# Patient Record
Sex: Male | Born: 1950 | Race: Black or African American | Hispanic: No | Marital: Married | Smoking: Never smoker
Health system: Southern US, Community
[De-identification: ages and names within clinical notes are randomized; demographics above are authoritative.]

## PROBLEM LIST (undated history)

## (undated) DIAGNOSIS — E7800 Pure hypercholesterolemia, unspecified: Secondary | ICD-10-CM

---

## 2002-06-18 ENCOUNTER — Emergency Department (HOSPITAL_COMMUNITY): Admission: EM | Admit: 2002-06-18 | Discharge: 2002-06-19 | Payer: Self-pay | Admitting: Emergency Medicine

## 2008-01-27 ENCOUNTER — Emergency Department (HOSPITAL_COMMUNITY): Admission: EM | Admit: 2008-01-27 | Discharge: 2008-01-27 | Payer: Self-pay | Admitting: Emergency Medicine

## 2008-02-27 ENCOUNTER — Ambulatory Visit (HOSPITAL_COMMUNITY): Admission: RE | Admit: 2008-02-27 | Discharge: 2008-02-27 | Payer: Self-pay | Admitting: Family Medicine

## 2008-09-02 IMAGING — CR DG LUMBAR SPINE COMPLETE 4+V
5 series · 5 of 5 positions shown · non-contrast
Comparison: None

CLINICAL DATA: History of lifting injury with chronic pain in the
lower back for a few weeks.

LUMBAR SPINE - COMPLETE 4+ VIEW

[t l-spine a.p.]
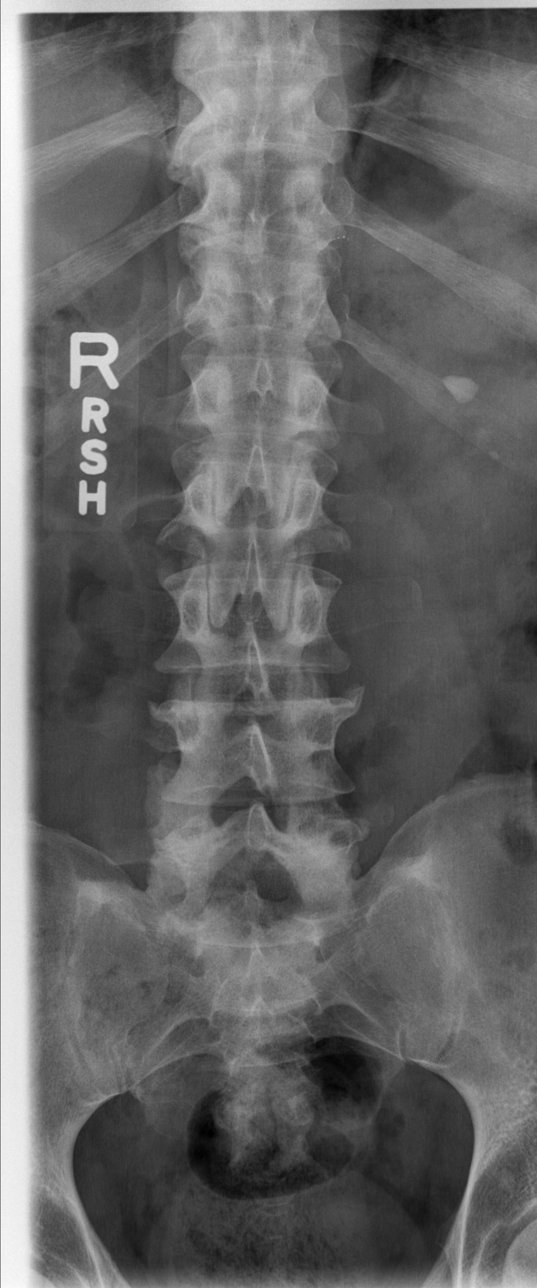

[t l-spine oblique exposure (1 of 2)]
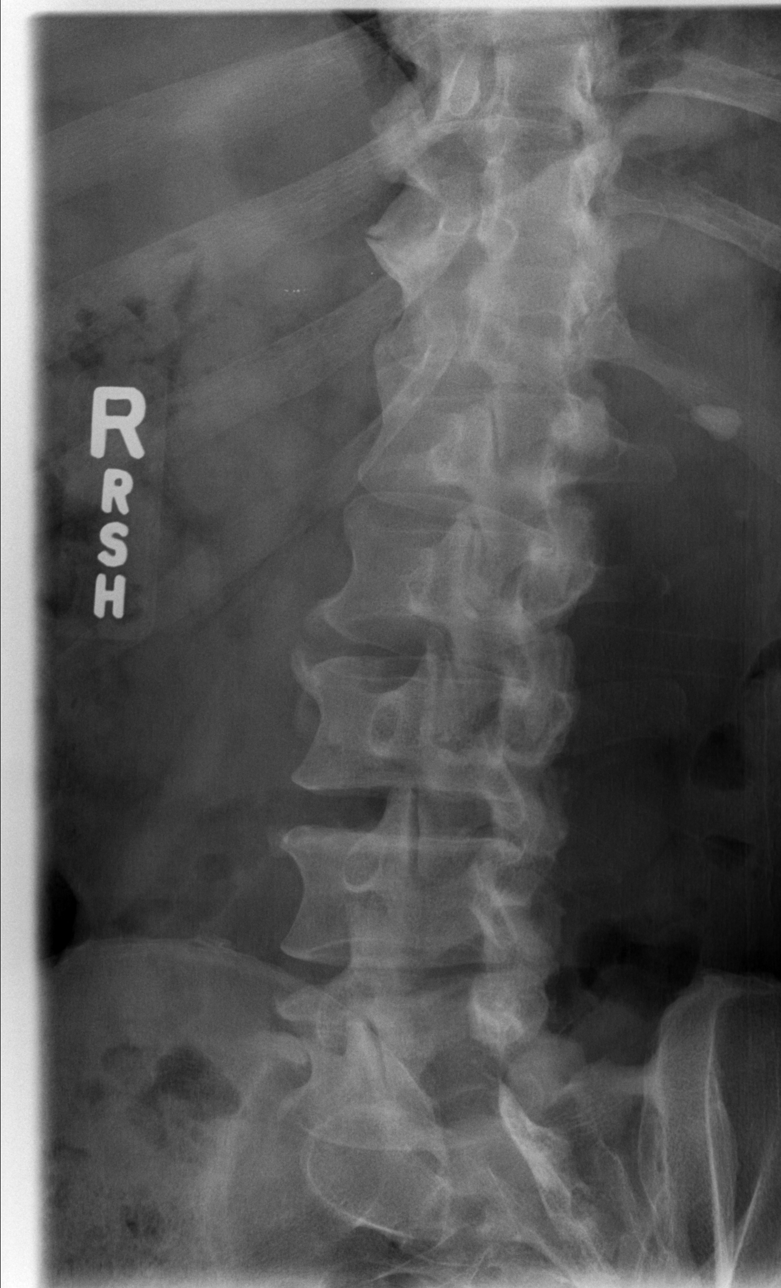

[t l-spine oblique exposure (2 of 2)]
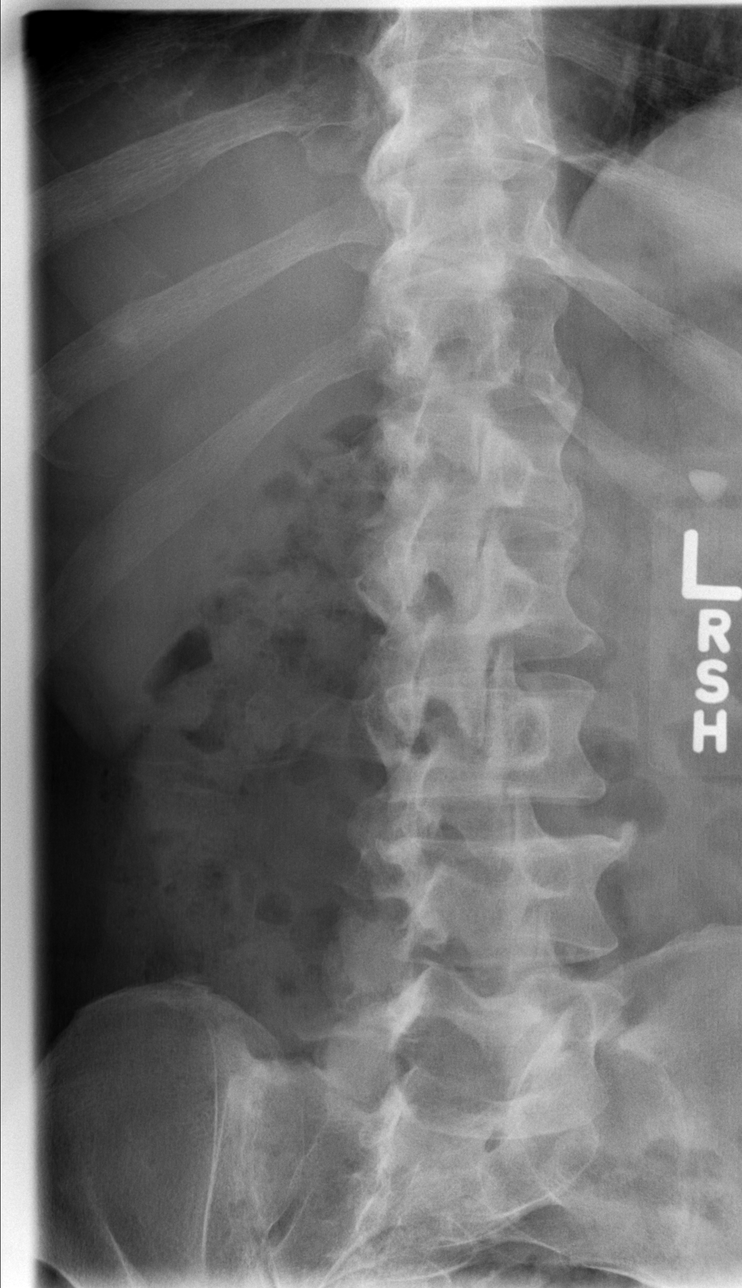

[t l-spine lat]
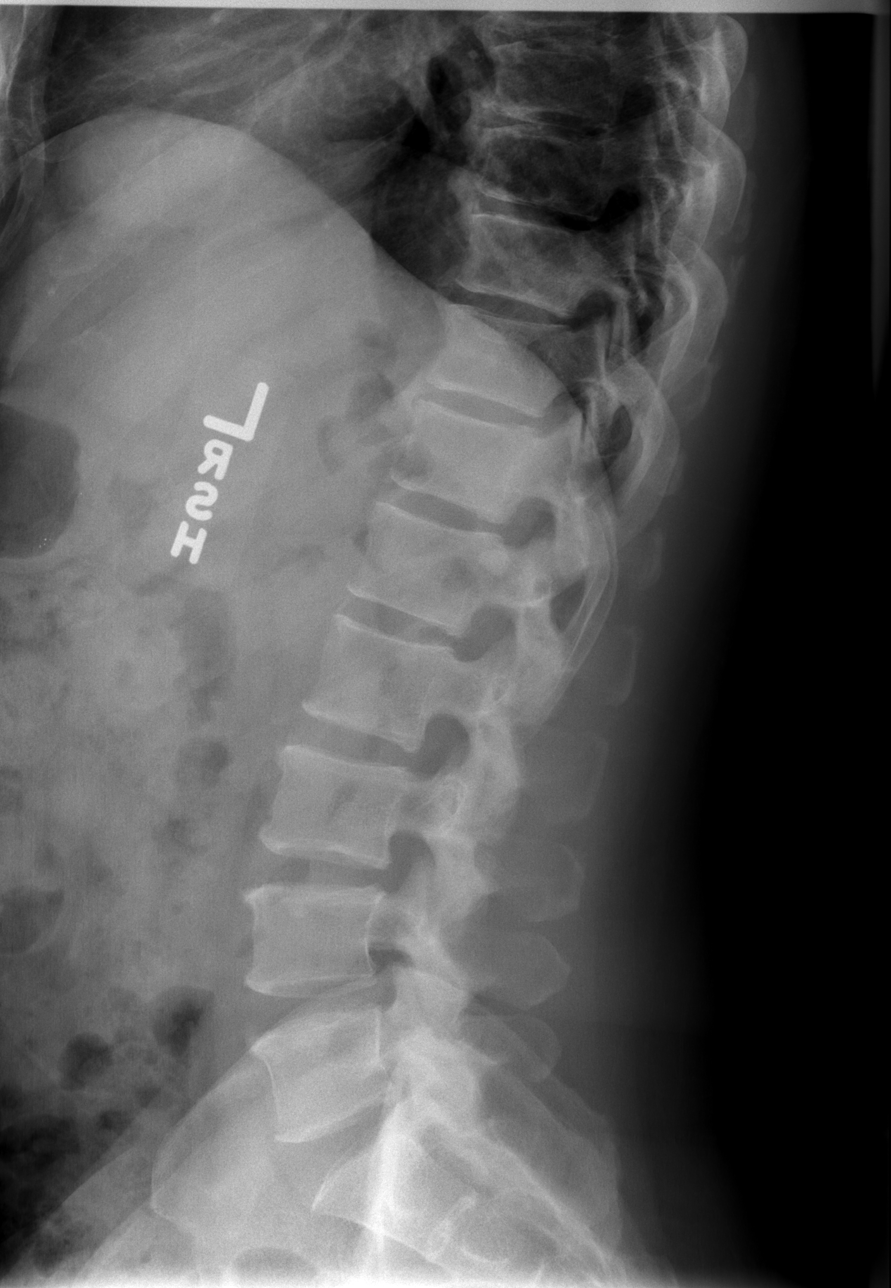

[t l-spine l5-s1 spot]
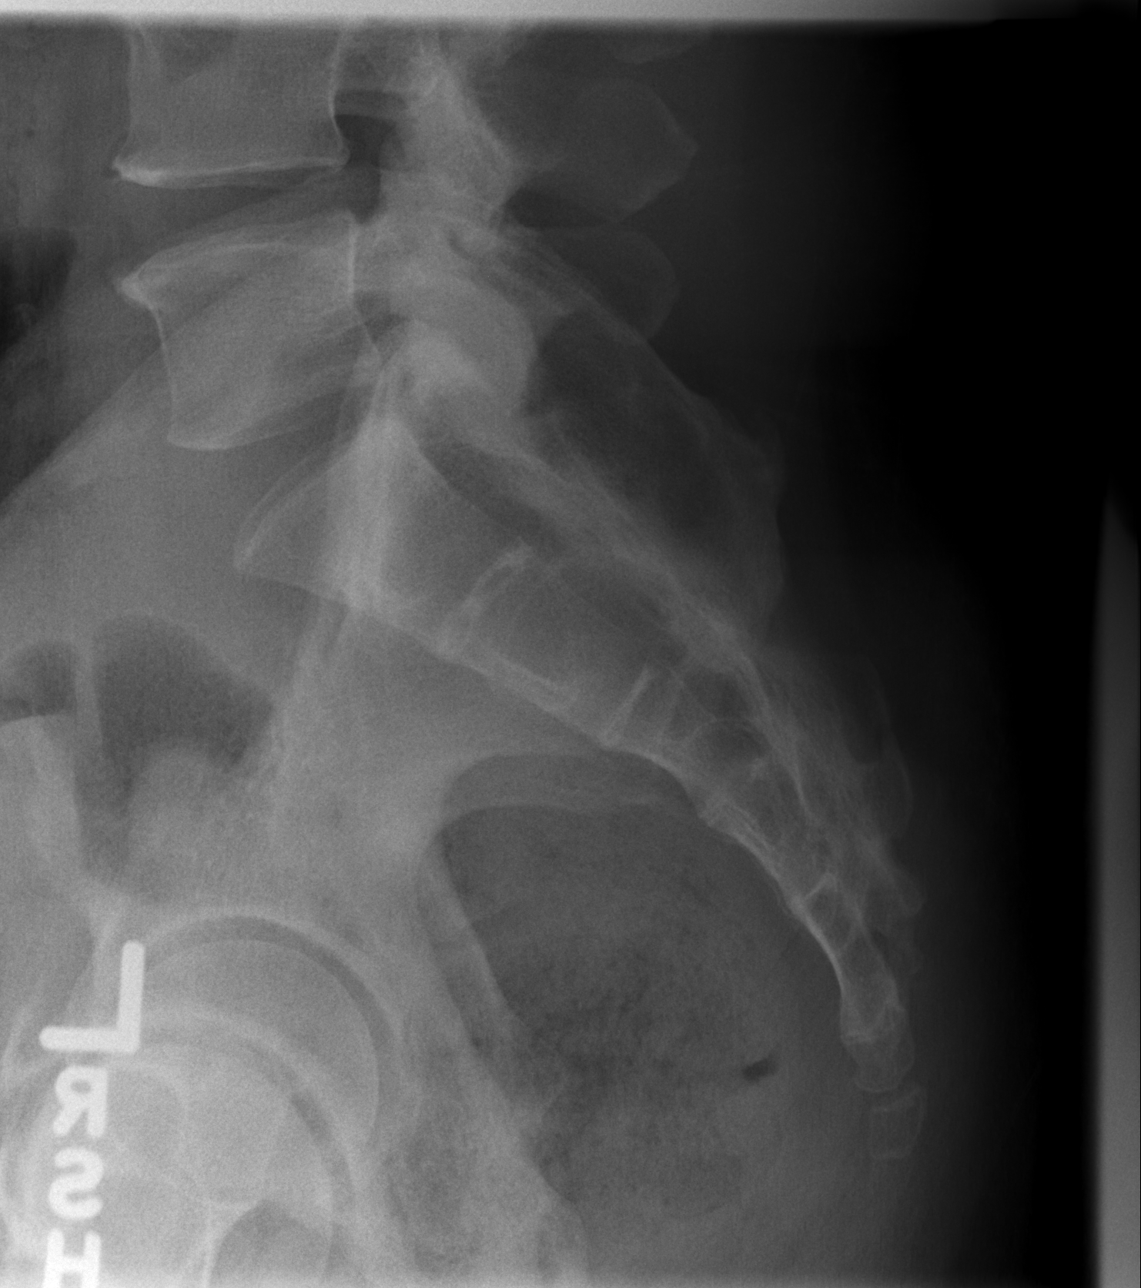

[5 of 5 positions shown; findings below may reference images not displayed]

FINDINGS: Multiple calcifications project over the left renal
outline most likely reflecting left nephrolithiasis.  The largest
calculus measures 11 by 8 mm.  There is fecal distension of the
colon.

Marginal osteophyte formation is seen at multiple levels.  No
fracture bony destruction is seen.  Intervertebral disc spaces are
maintained.  There is mild facet joint osteoarthropathy at the L5-
S1 level.  No sacroileitis is evident.
IMPRESSION: Hypertrophic osteophyte formation in the spine consistent with
degenerative spondylosis.  Facet joint osteoarthropathy at L5-S1
level is present.  Left nephrolithiasis is present.  Fecal
distension of colon is present.

## 2011-07-11 LAB — URINALYSIS, ROUTINE W REFLEX MICROSCOPIC
Leukocytes, UA: NEGATIVE
Nitrite: NEGATIVE
Specific Gravity, Urine: 1.021
pH: 5.5

## 2011-07-11 LAB — URINE MICROSCOPIC-ADD ON

## 2012-06-21 ENCOUNTER — Emergency Department (HOSPITAL_COMMUNITY): Payer: BC Managed Care – PPO

## 2012-06-21 ENCOUNTER — Encounter (HOSPITAL_COMMUNITY): Payer: Self-pay | Admitting: Emergency Medicine

## 2012-06-21 ENCOUNTER — Emergency Department (HOSPITAL_COMMUNITY)
Admission: EM | Admit: 2012-06-21 | Discharge: 2012-06-21 | Disposition: A | Discharge status: Home / Self Care | Payer: BC Managed Care – PPO | Attending: Emergency Medicine | Admitting: Emergency Medicine

## 2012-06-21 DIAGNOSIS — E119 Type 2 diabetes mellitus without complications: Secondary | ICD-10-CM | POA: Insufficient documentation

## 2012-06-21 DIAGNOSIS — E7800 Pure hypercholesterolemia, unspecified: Secondary | ICD-10-CM | POA: Insufficient documentation

## 2012-06-21 DIAGNOSIS — M79641 Pain in right hand: Secondary | ICD-10-CM

## 2012-06-21 DIAGNOSIS — M79609 Pain in unspecified limb: Secondary | ICD-10-CM | POA: Insufficient documentation

## 2012-06-21 HISTORY — DX: Pure hypercholesterolemia, unspecified: E78.00
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
## 2012-06-21 NOTE — ED Notes (Signed)
Ace wrap placed to right hand for support.
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
## 2020-01-08 ENCOUNTER — Ambulatory Visit: Payer: Self-pay | Attending: Internal Medicine

## 2020-01-08 DIAGNOSIS — Z23 Encounter for immunization: Secondary | ICD-10-CM

## 2020-01-08 NOTE — Progress Notes (Signed)
   Covid-19 Vaccination Clinic  Name:  Howard Williams    MRN: [id number] DOB: Jun 12, 1951  01/08/2020  [name] was observed post Covid-19 immunization for 15 minutes without incident. He was provided with Vaccine Information Sheet and instruction to access the V-Safe system.   [name] was instructed to call 911 with any severe reactions post vaccine: Marland Kitchen Difficulty breathing  . Swelling of face and throat  . A fast heartbeat  . A bad rash all over body  . Dizziness and weakness   Immunizations Administered    Name Date Dose VIS Date Route   Moderna COVID-19 Vaccine 01/08/2020 12:50 PM 0.5 mL 09/16/2019 Intramuscular   Manufacturer: Moderna   Lot: [id number]   NDC: 81157-262-03

## 2020-02-05 ENCOUNTER — Ambulatory Visit: Payer: Self-pay | Attending: Internal Medicine

## 2020-02-05 DIAGNOSIS — Z23 Encounter for immunization: Secondary | ICD-10-CM

## 2020-02-05 NOTE — Progress Notes (Signed)
   Covid-19 Vaccination Clinic  Name:  Howard Williams    MRN: [id number] DOB: 07-02-1951  02/05/2020  [name] was observed post Covid-19 immunization for 15 minutes without incident. He was provided with Vaccine Information Sheet and instruction to access the V-Safe system.   [name] was instructed to call 911 with any severe reactions post vaccine: Marland Kitchen Difficulty breathing  . Swelling of face and throat  . A fast heartbeat  . A bad rash all over body  . Dizziness and weakness   Immunizations Administered    Name Date Dose VIS Date Route   Moderna COVID-19 Vaccine 02/05/2020 10:17 AM 0.5 mL 09/2019 Intramuscular   Manufacturer: Gala Murdoch   Lot: [id number]   NDC: 24097-353-29
